# Patient Record
Sex: Male | Born: 1970 | Race: White | Hispanic: No | Smoking: Never smoker
Health system: Southern US, Community
[De-identification: ages and names within clinical notes are randomized; demographics above are authoritative.]

## PROBLEM LIST (undated history)

## (undated) DIAGNOSIS — F419 Anxiety disorder, unspecified: Secondary | ICD-10-CM

## (undated) DIAGNOSIS — I10 Essential (primary) hypertension: Secondary | ICD-10-CM

## (undated) HISTORY — DX: Anxiety disorder, unspecified: F41.9

## (undated) HISTORY — PX: VASECTOMY: SHX75

---

## 2003-11-07 LAB — HM HIV SCREENING LAB: HM HIV Screening: NEGATIVE

## 2003-11-07 LAB — HM HEPATITIS C SCREENING LAB: HM Hepatitis Screen: NEGATIVE

## 2019-08-13 ENCOUNTER — Other Ambulatory Visit (HOSPITAL_COMMUNITY): Payer: Self-pay | Admitting: Gastroenterology

## 2019-08-13 ENCOUNTER — Other Ambulatory Visit: Payer: Self-pay | Admitting: Gastroenterology

## 2019-08-13 DIAGNOSIS — R1011 Right upper quadrant pain: Secondary | ICD-10-CM

## 2019-08-18 ENCOUNTER — Other Ambulatory Visit: Payer: Self-pay

## 2019-08-18 ENCOUNTER — Encounter (HOSPITAL_COMMUNITY): Payer: Self-pay | Admitting: *Deleted

## 2019-08-18 ENCOUNTER — Emergency Department (HOSPITAL_COMMUNITY)
Admission: EM | Admit: 2019-08-18 | Discharge: 2019-08-18 | Discharge status: Against Medical Advice | Payer: PRIVATE HEALTH INSURANCE | Attending: Emergency Medicine | Admitting: Emergency Medicine

## 2019-08-18 ENCOUNTER — Emergency Department (HOSPITAL_COMMUNITY): Payer: PRIVATE HEALTH INSURANCE

## 2019-08-18 DIAGNOSIS — Z5321 Procedure and treatment not carried out due to patient leaving prior to being seen by health care provider: Secondary | ICD-10-CM | POA: Diagnosis not present

## 2019-08-18 DIAGNOSIS — R0789 Other chest pain: Secondary | ICD-10-CM | POA: Diagnosis present

## 2019-08-18 HISTORY — DX: Essential (primary) hypertension: I10

## 2019-08-18 NOTE — ED Triage Notes (Signed)
Pt reports ongoing episodes of mid chest pressure that are becoming more frequently. Has appt with GI md. Had episode Saturday of severe pain and tingling down bilateral arms and tremors to his legs. Had return of chest pressure this am.

## 2019-08-26 ENCOUNTER — Encounter (HOSPITAL_COMMUNITY)
Admission: RE | Admit: 2019-08-26 | Discharge: 2019-08-26 | Disposition: A | Discharge status: Home / Self Care | Payer: PRIVATE HEALTH INSURANCE | Source: Ambulatory Visit | Attending: Gastroenterology | Admitting: Gastroenterology

## 2019-08-26 ENCOUNTER — Other Ambulatory Visit: Payer: Self-pay

## 2019-08-26 DIAGNOSIS — R1011 Right upper quadrant pain: Secondary | ICD-10-CM | POA: Diagnosis not present

## 2019-08-26 MED ORDER — TECHNETIUM TC 99M MEBROFENIN IV KIT
5.0000 | PACK | Freq: Once | INTRAVENOUS | Status: AC | PRN
  Administered 2019-08-26: 5 via INTRAVENOUS

## 2021-08-29 IMAGING — NM NM HEPATO W/GB/PHARM/[PERSON_NAME]
2 series · 12 of 12 positions shown · non-contrast
Comparison: None

CLINICAL DATA: Intermittent chest and RIGHT upper quadrant
abdominal pain/pressure

EXAM:
NUCLEAR MEDICINE HEPATOBILIARY IMAGING WITH GALLBLADDER EF
TECHNIQUE: Sequential images of the abdomen were obtained [DATE] minutes
following intravenous administration of radiopharmaceutical. After
oral ingestion of Ensure, gallbladder ejection fraction was
determined. At 60 min, normal ejection fraction is greater than 33%.
RADIOPHARMACEUTICALS:  5.1 mCi Nc-YYm  Choletec IV

[he hepatobiliary · 4.52mm/px · 6 of 60 frames shown (1 of 2)]
[frame 6/60]
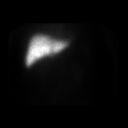
[frame 16/60]
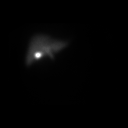
[frame 26/60]
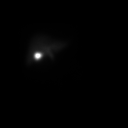
[frame 36/60]
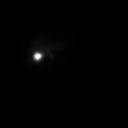
[frame 46/60]
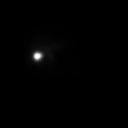
[frame 56/60]
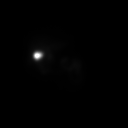

[he hepatobiliary · 4.52mm/px · 6 of 60 frames shown (2 of 2)]
[frame 6/60]
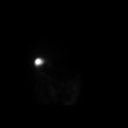
[frame 16/60]
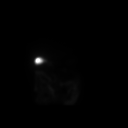
[frame 26/60]
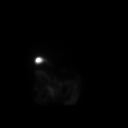
[frame 36/60]
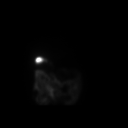
[frame 46/60]
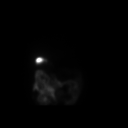
[frame 56/60]
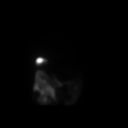

[12 of 12 positions shown; findings below may reference images not displayed]

FINDINGS: Normal tracer extraction from bloodstream indicating normal
hepatocellular function.

Normal excretion of tracer into biliary tree.

Gallbladder visualized at 10 min.

Small bowel visualized at 43 min.

No hepatic retention of tracer.

Subjectively normal emptying of tracer from gallbladder following
fatty meal stimulation.

Calculated gallbladder ejection fraction is 61%, normal.

Patient reported no symptoms following Ensure ingestion.

Normal gallbladder ejection fraction following Ensure ingestion is
greater than 33% at 1 hour.
IMPRESSION: Normal exam.

## 2023-11-07 ENCOUNTER — Ambulatory Visit (INDEPENDENT_AMBULATORY_CARE_PROVIDER_SITE_OTHER): Payer: No Typology Code available for payment source | Admitting: Nurse Practitioner

## 2023-11-07 VITALS — BP 146/100 | HR 82 | Temp 97.2°F | Ht 71.5 in | Wt 199.8 lb

## 2023-11-07 DIAGNOSIS — R7301 Impaired fasting glucose: Secondary | ICD-10-CM

## 2023-11-07 DIAGNOSIS — F1911 Other psychoactive substance abuse, in remission: Secondary | ICD-10-CM

## 2023-11-07 DIAGNOSIS — Z1211 Encounter for screening for malignant neoplasm of colon: Secondary | ICD-10-CM | POA: Diagnosis not present

## 2023-11-07 DIAGNOSIS — I10 Essential (primary) hypertension: Secondary | ICD-10-CM | POA: Insufficient documentation

## 2023-11-07 MED ORDER — LISINOPRIL 20 MG PO TABS: 20.0000 mg | ORAL_TABLET | Freq: Every day | ORAL | 1 refills | Status: DC

## 2023-11-07 MED ORDER — LISINOPRIL 10 MG PO TABS: 10.0000 mg | ORAL_TABLET | Freq: Every day | ORAL | 1 refills | Status: AC

## 2023-11-07 NOTE — Assessment & Plan Note (Addendum)
 Chronic, not controlled. Hypertension with persistently elevated readings and a family history of hypertension and heart disease. Lifestyle modifications have been insufficient, necessitating medication to reduce cardiovascular risk. Start lisinopril 10 mg once daily. Regular home blood pressure monitoring is advised. Order blood work to assess complete metabolic panel, complete blood count, thyroid function, and lipid profile. Follow up in 4 weeks to evaluate blood pressure control and medication efficacy.

## 2023-11-07 NOTE — Patient Instructions (Signed)
 It was great to see you!  We are checking your labs today and will let you know the results via mychart/phone.   I have ordered the cologuard and the kit will be mailed to your house  Keep checking your blood pressure at home and limit your salt.   Let's follow-up in 4 weeks, sooner if you have concerns.  If a referral was placed today, you will be contacted for an appointment. Please note that routine referrals can sometimes take up to 3-4 weeks to process. Please call our office if you haven't heard anything after this time frame.  Take care,  Rodman Pickle, NP

## 2023-11-07 NOTE — Assessment & Plan Note (Signed)
 He has a history of marijuana and cocaine abuse. He went to rehab and has been clean since 09/19/2003.

## 2023-11-07 NOTE — Progress Notes (Signed)
 New Patient Visit  BP (!) 146/100 (BP Location: Left Arm, Cuff Size: Large)   Pulse 82   Temp (!) 97.2 F (36.2 C)   Ht 5' 11.5" (1.816 m)   Wt 199 lb 12.8 oz (90.6 kg)   SpO2 99%   BMI 27.48 kg/m    Subjective:    Patient ID: Dylan Leonard, male    DOB: [birth year], 53 y.o.   MRN: [id number]  CC: Chief Complaint  Patient presents with   Establish Care    NP. Est. Care, concerns with elevated blood pressure    HPI: Dylan Leonard is a 53 y.o. male presents for new patient visit to establish care.  Introduced to Publishing rights manager role and practice setting.  All questions answered.  Discussed provider/patient relationship and expectations.  Discussed the use of AI scribe software for clinical note transcription with the patient, who gave verbal consent to proceed.  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 He first noticed these symptoms approximately two months ago, around the time he quit dipping. He has been monitoring his blood pressure at home and has noticed it fluctuates, with readings ranging from 124/77 to 146/100. He denies any chest pain or shortness of breath but reports occasional pressure in his chest, which he believes is related to gas. He has been trying to manage his blood pressure through lifestyle changes such as exercise and diet. He has not been on any blood pressure medication before and has no other significant medical history.     Depression and Anxiety Screen done:     11/07/2023    2:56 PM  Depression screen PHQ 2/9  Decreased Interest 0  Down, Depressed, Hopeless 1  PHQ - 2 Score 1  Altered sleeping 0  Tired, decreased energy 0  Change in appetite 0  Feeling bad or failure about yourself  1  Trouble concentrating 0  Moving slowly or fidgety/restless 0  Suicidal thoughts 0  PHQ-9 Score 2  Difficult doing work/chores Not difficult at all       11/07/2023    2:57 PM  GAD 7 : Generalized Anxiety Score  Nervous, Anxious, on Edge 0  Control/stop worrying 0  Worry too much - different things 0  Trouble relaxing 0  Restless 0  Easily annoyed or irritable 0  Afraid - awful might happen 0  Total GAD 7 Score 0  Anxiety Difficulty Not difficult at all    Past Medical History:  Diagnosis Date   Anxiety    Hypertension     Past Surgical History:  Procedure Laterality Date   VASECTOMY      Family History  Problem Relation Age of Onset   Hypothyroidism Mother    Hypothyroidism Father    Hypertension Father    Heart attack Father    Heart disease Paternal Aunt    Diabetes Paternal Aunt    Arthritis Paternal Aunt    Heart disease Paternal Grandmother    Heart disease Paternal Grandfather      Social History   Tobacco Use   Smoking status: Never   Smokeless tobacco: Former    Types: Chew, Snuff    Quit date: 08/2023  Vaping Use   Vaping status: Never Used  Substance Use Topics   Alcohol use: Yes    Comment: every once in a while   Drug use: Not Currently  Types: Cocaine, Marijuana    Comment: went to rehab clean since 09/19/2003    Current Outpatient Medications on File Prior to Visit  Medication Sig Dispense Refill   Misc Natural Products (BEET ROOT PO) Take by mouth.     No current facility-administered medications on file prior to visit.     Review of Systems  Constitutional: Negative.   HENT: Negative.    Respiratory: Negative.    Cardiovascular: Negative.   Gastrointestinal: Negative.   Genitourinary: Negative.   Musculoskeletal: Negative.   Skin: Negative.   Neurological: Negative.   Psychiatric/Behavioral: Negative.        Objective:    BP (!) 146/100 (BP Location: Left Arm, Cuff Size: Large)   Pulse 82   Temp (!) 97.2 F (36.2 C)   Ht 5' 11.5" (1.816 m)   Wt 199 lb 12.8 oz (90.6 kg)   SpO2 99%   BMI 27.48 kg/m   Wt Readings from Last 3 Encounters:  11/07/23 199 lb 12.8 oz  (90.6 kg)    BP Readings from Last 3 Encounters:  11/07/23 (!) 146/100  08/18/19 (!) 125/93    Physical Exam Vitals and nursing note reviewed.  Constitutional:      General: He is not in acute distress.    Appearance: Normal appearance.  HENT:     Head: Normocephalic and atraumatic.     Right Ear: Tympanic membrane, ear canal and external ear normal.     Left Ear: Tympanic membrane, ear canal and external ear normal.     Mouth/Throat:     Mouth: Mucous membranes are moist.     Pharynx: No posterior oropharyngeal erythema.  Eyes:     Conjunctiva/sclera: Conjunctivae normal.  Cardiovascular:     Rate and Rhythm: Normal rate and regular rhythm.     Pulses: Normal pulses.     Heart sounds: Normal heart sounds.  Pulmonary:     Effort: Pulmonary effort is normal.     Breath sounds: Normal breath sounds.  Abdominal:     Palpations: Abdomen is soft.     Tenderness: There is no abdominal tenderness.  Musculoskeletal:        General: Normal range of motion.     Cervical back: Normal range of motion and neck supple. No tenderness.     Right lower leg: No edema.     Left lower leg: No edema.  Lymphadenopathy:     Cervical: No cervical adenopathy.  Skin:    General: Skin is warm and dry.  Neurological:     General: No focal deficit present.     Mental Status: He is alert and oriented to person, place, and time.     Cranial Nerves: No cranial nerve deficit.     Coordination: Coordination normal.     Gait: Gait normal.  Psychiatric:        Mood and Affect: Mood normal.        Behavior: Behavior normal.        Thought Content: Thought content normal.        Judgment: Judgment normal.        Assessment & Plan:   Problem List Items Addressed This Visit       Cardiovascular and Mediastinum   Primary hypertension - Primary   Chronic, not controlled. Hypertension with persistently elevated readings and a family history of hypertension and heart disease. Lifestyle modifications  have been insufficient, necessitating medication to reduce cardiovascular risk. Start lisinopril 10 mg once daily. Regular home  blood pressure monitoring is advised. Order blood work to assess complete metabolic panel, complete blood count, thyroid function, and lipid profile. Follow up in 4 weeks to evaluate blood pressure control and medication efficacy.        Relevant Medications   lisinopril (ZESTRIL) 10 MG tablet   Other Relevant Orders   CBC with Differential/Platelet   Comprehensive metabolic panel   Lipid panel   TSH     Other   History of substance abuse (HCC)   He has a history of marijuana and cocaine abuse. He went to rehab and has been clean since 09/19/2003.       Other Visit Diagnoses       Screen for colon cancer       Cologuard ordered today   Relevant Orders   Cologuard        Follow up plan: Return in about 4 weeks (around 12/05/2023) for CPE.  Loden Laurent A Tahra Hitzeman

## 2023-11-08 LAB — COMPREHENSIVE METABOLIC PANEL WITH GFR
ALT: 26 U/L (ref 0–53)
AST: 19 U/L (ref 0–37)
Albumin: 4.7 g/dL (ref 3.5–5.2)
Alkaline Phosphatase: 74 U/L (ref 39–117)
BUN: 17 mg/dL (ref 6–23)
CO2: 27 meq/L (ref 19–32)
Calcium: 9.6 mg/dL (ref 8.4–10.5)
Chloride: 100 meq/L (ref 96–112)
Creatinine, Ser: 1.02 mg/dL (ref 0.40–1.50)
GFR: 85.5 mL/min (ref >60.00)
Glucose, Bld: 132 mg/dL — ABNORMAL HIGH (ref 70–99)
Potassium: 3.9 meq/L (ref 3.5–5.1)
Sodium: 139 meq/L (ref 135–145)
Total Bilirubin: 0.5 mg/dL (ref 0.2–1.2)
Total Protein: 7.1 g/dL (ref 6.0–8.3)

## 2023-11-08 LAB — CBC WITH DIFFERENTIAL/PLATELET
Basophils Absolute: 0.1 10*3/uL (ref 0.0–0.1)
Basophils Relative: 0.7 % (ref 0.0–3.0)
Eosinophils Absolute: 0.1 10*3/uL (ref 0.0–0.7)
Eosinophils Relative: 0.8 % (ref 0.0–5.0)
HCT: 45.3 % (ref 39.0–52.0)
Hemoglobin: 15.7 g/dL (ref 13.0–17.0)
Lymphocytes Relative: 32.9 % (ref 12.0–46.0)
Lymphs Abs: 2.4 10*3/uL (ref 0.7–4.0)
MCHC: 34.7 g/dL (ref 30.0–36.0)
MCV: 89.2 fl (ref 78.0–100.0)
Monocytes Absolute: 0.7 10*3/uL (ref 0.1–1.0)
Monocytes Relative: 9.3 % (ref 3.0–12.0)
Neutro Abs: 4.1 10*3/uL (ref 1.4–7.7)
Neutrophils Relative %: 56.3 % (ref 43.0–77.0)
Platelets: 192 10*3/uL (ref 150.0–400.0)
RBC: 5.07 Mil/uL (ref 4.22–5.81)
RDW: 12.2 % (ref 11.5–15.5)
WBC: 7.3 10*3/uL (ref 4.0–10.5)

## 2023-11-08 LAB — LDL CHOLESTEROL, DIRECT: Direct LDL: 97 mg/dL

## 2023-11-08 LAB — LIPID PANEL
Cholesterol: 190 mg/dL (ref 0–200)
HDL: 36.6 mg/dL — ABNORMAL LOW (ref >39.00)
NonHDL: 153.14
Total CHOL/HDL Ratio: 5
Triglycerides: 481 mg/dL — ABNORMAL HIGH (ref 0.0–149.0)
VLDL: 96.2 mg/dL — ABNORMAL HIGH (ref 0.0–40.0)

## 2023-11-09 LAB — TSH: TSH: 2.04 u[IU]/mL (ref 0.35–5.50)

## 2023-11-09 LAB — HEMOGLOBIN A1C: Hgb A1c MFr Bld: 7.1 % — ABNORMAL HIGH (ref 4.6–6.5)

## 2023-11-09 MED ORDER — LANCETS MISC. MISC: 1.0000 | Freq: Every day | 0 refills | Status: AC

## 2023-11-09 MED ORDER — BLOOD GLUCOSE MONITORING SUPPL DEVI: 1.0000 | Freq: Every day | 0 refills | Status: AC

## 2023-11-09 MED ORDER — BLOOD GLUCOSE TEST VI STRP: 1.0000 | ORAL_STRIP | Freq: Every day | 0 refills | Status: AC

## 2023-11-09 NOTE — Addendum Note (Signed)
 Addended by: Rodman Pickle A on: 11/09/2023 04:18 PM   Modules accepted: Orders

## 2023-11-09 NOTE — Addendum Note (Signed)
 Addended by: Rodman Pickle A on: 11/09/2023 02:01 PM   Modules accepted: Orders

## 2023-12-12 ENCOUNTER — Encounter: Admitting: Nurse Practitioner
